# Patient Record
Sex: Female | Born: 1940 | Hispanic: No | Marital: Married | Smoking: Never smoker
Health system: Southern US, Community
[De-identification: ages and names within clinical notes are randomized; demographics above are authoritative.]

---

## 1998-07-23 ENCOUNTER — Other Ambulatory Visit: Admission: RE | Admit: 1998-07-23 | Discharge: 1998-07-23 | Payer: Self-pay | Admitting: Obstetrics and Gynecology

## 2002-05-20 ENCOUNTER — Other Ambulatory Visit: Admission: RE | Admit: 2002-05-20 | Discharge: 2002-05-20 | Payer: Self-pay | Admitting: Gynecology

## 2003-05-01 ENCOUNTER — Encounter: Admission: RE | Admit: 2003-05-01 | Discharge: 2003-05-01 | Payer: Self-pay | Admitting: Gynecology

## 2003-06-17 ENCOUNTER — Other Ambulatory Visit: Admission: RE | Admit: 2003-06-17 | Discharge: 2003-06-17 | Payer: Self-pay | Admitting: Gynecology

## 2004-07-01 ENCOUNTER — Encounter: Admission: RE | Admit: 2004-07-01 | Discharge: 2004-07-01 | Payer: Self-pay | Admitting: Gynecology

## 2005-05-20 ENCOUNTER — Encounter: Admission: RE | Admit: 2005-05-20 | Discharge: 2005-05-20 | Payer: Self-pay | Admitting: Unknown Physician Specialty

## 2010-05-09 ENCOUNTER — Encounter: Payer: Self-pay | Admitting: Gynecology

## 2021-04-23 ENCOUNTER — Encounter: Payer: Self-pay | Admitting: Internal Medicine

## 2021-04-23 ENCOUNTER — Ambulatory Visit: Payer: Medicare Other | Admitting: Internal Medicine

## 2021-04-23 ENCOUNTER — Other Ambulatory Visit: Payer: Self-pay

## 2021-04-23 VITALS — BP 122/68 | HR 78 | Temp 98.0°F | Ht 59.0 in | Wt 117.8 lb

## 2021-04-23 DIAGNOSIS — Z836 Family history of other diseases of the respiratory system: Secondary | ICD-10-CM | POA: Diagnosis not present

## 2021-04-23 DIAGNOSIS — J849 Interstitial pulmonary disease, unspecified: Secondary | ICD-10-CM

## 2021-04-23 NOTE — Patient Instructions (Signed)
ICD-10-CM   1. ILD (interstitial lung disease) (HCC)  J84.9     2. Family history of pulmonary fibrosis  Z83.6       Unclear cause of pulmnary fibrosis Best to restage and reassess  Plan - do full PFT  - do HRCT supine and prone - ideally within our system so we can have our radiologist look at image - get CD Rom of outside CT and drop it off to [place] so we can load into our system for comparison - refer [facility] genetics counselor Maylon Cos and associates  Followup  - return to see Dr Marchelle Gearing 30 min visit but after CT and local PFT

## 2021-04-23 NOTE — Progress Notes (Signed)
Kelli Steele  Kelli Steele is a 81 y.o. female who presents for evaluation of shortness of breath. Patient had COVID in February and since then has had difficulty with shortness of breath. She denies any significant cough and has had recent difficulty with sinusitis. In the past she has had trouble with allergic rhinitis and occasional bronchitis with wheezing. There is no prior history of cigarette smoking. There is no prior history of definite lung disease. Because of the shortness of breath symptoms she underwent cardiac CT which showed obstruction and subsequently underwent stent placement in March. Unfortunately it did not help her shortness of breath symptoms. An echocardiogram at that time showed mild aortic stenosis and mild TR and MR. With mild pulmonary hypertension in the 37 to 49 mmHg range. Patient currently denies any significant cough symptoms. She denies exposures to any organic or organic dust such as asbestos, but does coal dust or mold. She has no pet birds. There is no history of connective tissue disease. 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 She is accompanied by her husband today. She worked in Insurance underwriter. She loves to play golf.   1. Interstitial lung disease  Atypical pattern for IPF although is at risk since her brother had it. One other possibility is post-COVID pulmonary infiltrates since her symptoms seem to start when she got infected 3 months ago. We will plan repeat high-resolution CT chest in 2 months with return appointment follow. In the meantime will order serologic tests  [facility] Aug Steele 8/15/Steele Kelli Steele is a 81 y.o. female who presents for follow up appointment for ILD. Hx of COVID 2/Steele. 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 She was treated with a 3-day hospital course of nitazoxanide. Today she reports she has continued to improve since hospitalization in June for Cryptosporidium. She had SOB, cough and wheezing which has resolved at this time. Her breathing improved with treatment and use of albuterol inhaler. She does have an occasional irregular heartbeat. She continues to follow up with cardiology. Denies fever, chills, sweats, hemoptysis, changes in appetite or weight, chest pain or tightness. She has started to increase her activity since being in the hospital, walking daily. She would like to be able to play golf again   OV 04/23/2021 --established patient of Dr. Darrick Huntsman at [facility] in [place] - seeing Dr Chase Caller at ILD center [facility]  Subjective:  Patient ID: Kelli Steele, female , DOB: 05/01/1940 , age 32 y.o. , MRN: [id number] , ADDRESS: [place] [address] PCP No primary care provider on file. Patient Care Team: Merian Capron, MD (Unknown Physician Specialty)  This Provider for this visit: Treatment Team:  Attending Provider: Brand Males, MD Cardiioogist  Lamar Blinks  - [facility] - Dr Tilden Dome - [place]   04/23/2021  Chief Complaint  Patient presents with   Consult    Pt is here for a second opinion due to diagnosis of ILD. Pt states she was diagnosed 5/Steele. States she has complaints of SOB with activities.     HPI Kelli Steele 89 y.o. -she is church colleague and friend of Kelli Steele.  They started talking about either having pulmonary fibrosis and their brothers also having  her pulmonary fibrosis and disease.  She sees Dr. Tilden Dome at [facility].  But after talking to her friend she decided to come to the ILD center here in [place].  History is gained from talking to her and also review of the medical records.  She tells me that in 2020 she never had any  problems.  Then in February 2020 right before the onset of the pandemic she had COVID-like symptoms.  That is when she had cough.  After that she seemed to be doing okay.  She is an avid Air cabin crew.  Then in early Steele she had atrial fibrillation in the aftermath of that had a cardiac stent but also ended up with COVID.  She says CT scan after that showed pulmonary fibrosis there was more on the right side of the lung according to history.  Then by May Steele she had severe diarrhea and was diagnosed with cryptosporidium.  She was admitted significantly deconditioned but since then has improved.  In the aftermath of this she did see [facility].  Based on the descriptions of the CT scan findings were not typical for UIP/IPF.  Therefore he opted to repeat CT scan and get serologies.  The serologies I reviewed is negative.  When she did a repeat CT scan in July Steele report below.  The findings are again inconsistent with UIP.  She has had only one pulmonary function test that shows ventilatory defect but normal DLCO and this was in May Steele.  She did give some sputum and apparently this was negative for AFB.  I subsequently confirm this.  She is really puzzled if she has IPF or non-IPF.  And if it is not IPF is related to Cryptosporidium or other etiologies.  She is wondering what the relationship of her brothers ILD and her condition is.   [facility] Comprehensive ILD Questionnaire  Symptoms:  SYMPTOM SCALE - ILD 04/23/2021  Current weight   O2 use ra  Shortness of Breath 0 -> 5 scale with 5 being worst (score 6 If unable to do)  At rest 0  Simple tasks - showers, clothes change, eating, shaving 0  Household (dishes, doing bed, laundry) 2  Shopping 1  Walking level at own pace 1  Walking up Stairs 3  Total (30-36) Dyspnea Score 5  How bad is your cough? 0  How bad is your fatigue 2  How bad is nausea 0  How bad is vomiting?  0  How bad is diarrhea? 0  How bad is anxiety? 2  How bad  is depression 1  Any chronic pain - if so where and how bad no       Past Medical History :  -Gives a diagnosis of asthma in October Steele with asthma symptoms and bronchitis symptoms - No collagen vascular disease - Positive for acid reflux - Positive for thyroid problems - Atrial fibrillation  -She is vaccinated against COVID but she thinks she got COVID in February 2020 while in [place] but then again?  February Steele   ROS:  -Positive for fatigue for the last several weeks.- -Positive for dry eyes.= -She has lost weight since February Steele -Positive for mild nausea  FAMILY HISTORY of LUNG DISEASE:  -Brother died from pulmonary fibrosis  PERSONAL EXPOSURE HISTORY:  -Non-smoker.  As a child her mother and father smoked but they quit at the age of 64.  She never did any marijuana.  Other than once in a while very little  in the remote past.  She used cocaine once at a party very little.  This was in the remote past  HOME  EXPOSURE and HOBBY DETAILS :   [place].  Age of the current home is 62.  No dampness.  No mold or mildew.  No current humidifier use.  She does have a nebulizer with is no mold or mildew in it.  She does have a [place] which she uses once in a while.  No misting Fountain no pet birds no pet gerbils.  She does have a goose down feather.  She does not know if there is mold in the [facility] duct.  She does do gardening of flowers.  She does get exposed to many of her damp oil and woodchips around the flower garden.  In essence gardening and down pillow or organic antigen exposures at home  OCCUPATIONAL HISTORY (122 questions) : Extensive list of inorganic antigen exposures and organic antigen exposures are negative  PULMONARY TOXICITY HISTORY (27 items):  Denies any amiodarone or nitrofurantoin  INVESTIGATIONS:    Autoimmune panel May Steele at [place] negative, anti-Jo 1 negative, Sjogren's negative, scleroderma  negative, rheumatoid factor negative -Hypersensitive pneumonitis panel Aspergillus or obesity and pigeon serum negative -ESR 11 -Angiotensin-converting enzymes 68 and considered normal in May Steele at [facility]   PFT    PFT data for ILD  [facility] may Steele  FVC 1.19  FVC % 53%  Ratio 112%  TLC   TLC %   DLCO 28  DLCO % 172%   Simple office walk 185 feet x  3 laps goal with forehead probe 04/23/2021    O2 used ra   Number laps completed avg   Comments about pace 3   Resting Pulse Ox/HR 100% and 78/min   Final Pulse Ox/HR 98% and 95/min   Desaturated </= 88% no   Desaturated <= 3% points no   Got Tachycardic >/= 90/min yes   Symptoms at end of test Mild dysnea   Miscellaneous comments x      CT Chest data - HRCT  July Steele at [place]. Image NA  This result has an attachment that is not available.  COMPARISON: 5/4/Steele.  INDICATION: ild  TECHNIQUE:  CT CHEST HIGH RESOLUTION -helical CT was performed through the chest with high-resolution technique which includes some space high-resolution axial sections. Neither prone, nor Expiration views were obtained. Contrast: None Thin axial sections were performed. Multiplanar reformats were generated on a workstation and saved to PACS.  Radiation dose reduction was utilized (automated exposure control, mA or kV adjustment based on patient size, or iterative image reconstruction).   Exam date/time: 7/11/Steele 12:18 PM   FINDINGS:   LOWER NECK AND AXILLAE:  Scant thyroid tissue seen. Axillary regions are also grossly unremarkable.   LUNGS/PLEURA:  Primarily in the upper lobes, particularly on the right, there there are peribronchovascular infiltrates with minimal nodularity an peripheral interstitial septal thickening. Mild bronchial wall thickening, without bronchiectasis. There is calcification in the tracheobronchial tree which is a benign finding. The pattern and extent of the infiltrates anterior interstitial changes is nearly  identical to study from 5/4/Steele.  No pneumothorax.  No abnormal pulmonary masses.  No pleural effusions, but there is some pleural or subpleural fascial thickening in the upper lobes.   HEART/MEDIASTINUM:  Cardiac size is normal.  Coronary Vessels: Moderate three-vessel coronary atherosclerosis.  No acute thoracic aortic abnormalities. Ascending aorta is  2.7 cm.  No hilar, mediastinal, or axillary lymphadenopathy.   MUSCULOSKELETAL:  No acute or destructive osseous processes.   CHRONIC/INCIDENTAL FINDINGS:  #  Mild distal esophageal thickening is some gastroesophageal reflux.  #  Interval development of some ascites.  #  Limited visualization of the upper abdomen is otherwise benign-appearing Procedure Note  Stark Jock, MD - 07/11/Steele  Formatting of this note might be different from the original.  COMPARISON: 5/4/Steele.  INDICATION: ild  TECHNIQUE:  CT CHEST HIGH RESOLUTION -helical CT was performed through the chest with high-resolution technique which includes some space high-resolution axial sections. Neither prone, nor Expiration views were obtained. Contrast: None Thin axial sections were performed. Multiplanar reformats were generated on a workstation and saved to PACS.  Radiation dose reduction was utilized (automated exposure control, mA or kV adjustment based on patient size, or iterative image reconstruction).   Exam date/time: 7/11/Steele 12:18 PM   FINDINGS:   LOWER NECK AND AXILLAE:  Scant thyroid tissue seen. Axillary regions are also grossly unremarkable.   LUNGS/PLEURA:  Primarily in the upper lobes, particularly on the right, there there are peribronchovascular infiltrates with minimal nodularity an peripheral interstitial septal thickening. Mild bronchial wall thickening, without bronchiectasis. There is calcification in the tracheobronchial tree which is a benign finding. The pattern and extent of the infiltrates anterior interstitial changes is nearly  identical to study from 5/4/Steele.  No pneumothorax.  No abnormal pulmonary masses.  No pleural effusions, but there is some pleural or subpleural fascial thickening in the upper lobes.   HEART/MEDIASTINUM:  Cardiac size is normal.  Coronary Vessels: Moderate three-vessel coronary atherosclerosis.  No acute thoracic aortic abnormalities. Ascending aorta is 2.7 cm.  No hilar, mediastinal, or axillary lymphadenopathy.   MUSCULOSKELETAL:  No acute or destructive osseous processes.   CHRONIC/INCIDENTAL FINDINGS:  #  Mild distal esophageal thickening is some gastroesophageal reflux.  #  Interval development of some ascites.  #  Limited visualization of the upper abdomen is otherwise benign-appearing    IMPRESSION:  1.  Stable pattern of right greater than left, predominantly upper lobe peribronchial vascular infiltrates in an area interstitial bands and focal peripheral interstitial septal thickening. Bilateral upper lobe pleural or subpleural fascial thickening.  2.  New ascites in the upper abdomen.  3.  Coronary atherosclerosis.  4.  Mild distal esophageal thickening and gastroesophageal reflux.    No results found.    PFT  No flowsheet data found.     has no past medical history on file.   reports that she has never smoked. She has never used smokeless tobacco.  The histories are not reviewed yet. Please review them in the "History" navigator section and refresh this [place].  Allergies  Allergen Reactions   Doxycycline Diarrhea and Rash   Sulfur Hives   Codeine Itching    Immunization History  Administered Date(s) Administered   Influenza, High Dose Seasonal PF 12/01/Steele   PFIZER(Purple Top)SARS-COV-2 Vaccination 04/30/2019, 05/21/2019   Pneumococcal Conjugate-13 04/05/2013   Pneumococcal Polysaccharide-23 07/11/2008   Zoster Recombinat (Shingrix) 12/31/2016, 05/06/2017   Zoster, Live 09/10/2009    No family history on file.   Current Outpatient  Medications:    albuterol (ACCUNEB) 1.25 MG/3ML nebulizer solution, Take 3 mLs (1.25 mg dose) by nebulization every 6 (six) hours as needed for Wheezing., Disp: , Rfl:    ALPRAZolam (XANAX) 0.25 MG tablet, Take 0.25 mg by mouth 2 (two) times daily as needed., Disp: , Rfl:    aspirin EC 81 MG  tablet, Take 81 mg by mouth daily. Swallow whole., Disp: , Rfl:    atorvastatin (LIPITOR) 10 MG tablet, Take by mouth., Disp: , Rfl:    carvedilol (COREG) 25 MG tablet, Take 25 mg by mouth 2 (two) times daily., Disp: , Rfl:    diltiazem (CARDIZEM SR) 120 MG 12 hr capsule, Take by mouth., Disp: , Rfl:    fluticasone (FLONASE) 50 MCG/ACT nasal spray, Place 2 sprays into both nostrils daily., Disp: , Rfl:    furosemide (LASIX) 20 MG tablet, Take 20 mg by mouth 2 (two) times daily., Disp: , Rfl:    levothyroxine (SYNTHROID) 75 MCG tablet, SMARTSIG:1 Tablet(s) By Mouth 6 Times a Week, Disp: , Rfl:    metolazone (ZAROXOLYN) 2.5 MG tablet, Take 2.5 mg by mouth once a week., Disp: , Rfl:    metroNIDAZOLE (METROGEL) 0.75 % gel, Apply topically., Disp: , Rfl:    montelukast (SINGULAIR) 10 MG tablet, Take 10 mg by mouth daily., Disp: , Rfl:    nitroGLYCERIN (NITROSTAT) 0.4 MG SL tablet, Place under the tongue., Disp: , Rfl:    potassium chloride SA (KLOR-CON M) 20 MEQ tablet, Take 20 mEq by mouth 2 (two) times daily., Disp: , Rfl:       Objective:   Vitals:   04/23/21 1044  BP: 122/68  Pulse: 78  Temp: 98 F (36.7 C)  TempSrc: Oral  SpO2: 100%  Weight: 117 lb 12.8 oz (53.4 kg)  Height: 4' 11"  (1.499 m)    Estimated body mass index is 23.79 kg/m as calculated from the following:   Height as of this encounter: 4' 11"  (1.499 m).   Weight as of this encounter: 117 lb 12.8 oz (53.4 kg).  @WEIGHTCHANGE @  Filed Weights   04/23/21 1044  Weight: 117 lb 12.8 oz (53.4 kg)     Physical Exam  General: No distress. thin Neuro: Alert and Oriented x 3. GCS 15. Speech normal Psych: Pleasant Resp:  Barrel  Chest - no.  Wheeze - no, Crackles - no, No overt respiratory distress CVS: Normal heart sounds. Murmurs - no Ext: Stigmata of Connective Tissue Disease - no HEENT: Normal upper airway. PEERL +. No post nasal drip        Assessment:       ICD-10-CM   1. ILD (interstitial lung disease) ([place])  J84.9 Pulmonary function test    CT Chest High Resolution    Ambulatory referral to Genetics    2. Family history of pulmonary fibrosis  Z83.6 Ambulatory referral to Genetics     The story and CT features are inconsistent with UIP.  On the other hand she does seem to have a family history of ILD which can then cause variable phenotypic expressions of ILD.  She also seems to have organic antigen exposure at home making her a candidate for hypersensitive pneumonitis.  The CT features are more in the upper lobe.  However I do not have the images with me.  Its been more than 6 months since he had a CT scan and a breathing test.  She is agreed to get this done and restage.  She is also agreed to meet with our genetics counselor.  She will get the old CT scan.  We will get all this data and then regroup.  At this point she appears overall stable Plan:     Patient Instructions     ICD-10-CM   1. ILD (interstitial lung disease) ([place])  J84.9     2.  Family history of pulmonary fibrosis  Z83.6       Unclear cause of pulmnary fibrosis Best to restage and reassess  Plan - do full PFT  - do HRCT supine and prone - ideally within our system so we can have our radiologist look at image - get CD Rom of outside CT and drop it off to [place] so we can load into our system for comparison - refer [facility] genetics counselor Roma Kayser and associates  Followup  - return to see Dr Chase Caller 30 min visit but after CT and local PFT    SIGNATURE    Dr. Brand Males, M.D., F.C.C.P,  Pulmonary and Critical Care Medicine Staff Physician, [place] Director - Interstitial Lung Disease   Program  Pulmonary [place] at [place], [place], [address]  Pager: [phone], If no answer or between  15:00h - 7:00h: call [phone] Telephone: [phone]  6:06 PM 04/23/2021

## 2021-04-28 ENCOUNTER — Telehealth: Payer: Self-pay | Admitting: Genetic Counselor

## 2021-04-28 NOTE — Telephone Encounter (Signed)
Scheduled appt per 1/6 referral. Pt is aware of appt date and time. Mailed updated calendar to pt per pt request.

## 2021-05-20 ENCOUNTER — Other Ambulatory Visit: Payer: Medicare Other

## 2021-05-25 ENCOUNTER — Other Ambulatory Visit: Payer: Self-pay

## 2021-05-25 ENCOUNTER — Ambulatory Visit (INDEPENDENT_AMBULATORY_CARE_PROVIDER_SITE_OTHER): Payer: Medicare Other

## 2021-05-25 DIAGNOSIS — J849 Interstitial pulmonary disease, unspecified: Secondary | ICD-10-CM

## 2021-06-08 ENCOUNTER — Other Ambulatory Visit: Payer: Self-pay

## 2021-06-08 ENCOUNTER — Encounter: Payer: Self-pay | Admitting: Genetic Counselor

## 2021-06-08 ENCOUNTER — Inpatient Hospital Stay: Payer: Medicare Other | Attending: Internal Medicine | Admitting: Genetic Counselor

## 2021-06-08 ENCOUNTER — Inpatient Hospital Stay: Payer: Medicare Other

## 2021-06-08 DIAGNOSIS — Z836 Family history of other diseases of the respiratory system: Secondary | ICD-10-CM

## 2021-06-08 NOTE — Progress Notes (Signed)
REFERRING PROVIDER: Kalman Shan, MD [address] [place],  [place] [address]  PRIMARY PROVIDER:  Pcp, No  PRIMARY REASON FOR VISIT:  1. Family history of pulmonary fibrosis     HISTORY OF PRESENT ILLNESS:   Ms. Barbero, a 81 y.o. female, was seen for a [facility] cancer genetics consultation at the request of Dr. Marchelle Gearing due to a family history of pulmonary fibrosis.  Ms. Cornia presents to clinic today to discuss the possibility of a hereditary predisposition to pulmonary fibrosis, to discuss genetic testing, and to further clarify her future risks, as well as potential risks for family members.   Ms. Trinidad has a personal history of interstitial lung disease diagnosed at age 54. 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 She has never smoked and did not have premature greying.   FAMILY HISTORY:  We obtained a detailed, 4-generation family history.  Significant diagnoses are listed below: Family History  Problem Relation Age of Onset   Emphysema Mother    COPD Sister    Pulmonary fibrosis Brother    Lung cancer Maternal Uncle    Ms. Safran brother was diagnosed with pulmonary fibrosis at age 79 and died 3 weeks later. She does not have any additional family history of pulmonary fibrosis nor premature graying.    DISCUSSION: Pulmonary fibrosis is a group of lung diseases characterized by development of scarring in the lungs.  There are several different subtypes of pulmonary fibrosis, including idiopathic pulmonary fibrosis (IPF), with different causes that are defined based on their appearance on imaging tests like a CT scan or how lung tissue looks under the microscope after a lung biopsy.  The incidence of pulmonary fibrosis in the general population is approximately 6-16/100,000 people in the [place].     Familial Pulmonary Fibrosis (FPF) is defined by having two or more first degree relatives (parent, sibling or child) in a  family with a diagnosis of pulmonary fibrosis.  In FPF, there is evidence that an underlying inherited genetic component may contribute to the development of pulmonary fibrosis, but environmental factors may also contribute. At present, several genes are known to be associated with FPF and account for approximately 20-25% of FPF cases.  Thus, additional genes remain to be discovered.     Approximately 20% of patients with FPF have a change (or mutation) in one of their telomerase genes, including TERT, TERC, RTEL1, and PARN.  Individuals with mutations in these genes may also be at risk for other health conditions such as low blood counts, early gray hair, or liver disease.  Rarely, FPF is due to mutations in other genes such as SFTPC or SFTPA2.   The inheritance pattern in FPF is unclear and may vary from family to family, but autosomal dominance with reduced penetrance appears most likely.  This means that it only takes one copy of the genetic factor inherited from one parent to have risk for the condition and there is a 50/50 chance of inheriting that genetic factor.  However, reduced penetrance means that not all individuals who inherit the genetic factor will develop symptoms or the disease.    We reviewed the characteristics, features and inheritance patterns of hereditary pulmonary fibrosis syndromes. We also discussed genetic testing, including the process of testing, insurance coverage, and turn-around-time for results. We discussed the implications of a negative, positive, carrier and/or variant of uncertain significant result. We recommended Ms. Pescador pursue genetic testing for the Pulmonary Fibrosis gene  panel through [facility].  PLAN: After considering the risks, benefits, and limitations, Ms. Herrmann provided informed consent to pursue genetic testing and the blood sample was sent to [facility] for analysis of the Pulmonary Fibrosis Gene Panel. Results should be available within approximately  8 weeks' time, at which point they will be disclosed by telephone to Ms. Marich, as will any additional recommendations warranted by these results.   Ms. Isais questions were answered to her satisfaction today. Our contact information was provided should additional questions or concerns arise. Thank you for the referral and allowing [place] to share in the care of your patient.   Lalla Brothers, MS, [facility] Genetic Counselor [email or web address] (P) [phone]  The patient was seen for a total of 55 minutes in face-to-face genetic counseling. The patient was seen alone.  Drs. Pamelia Hoit and/or Mosetta Putt were available to discuss this case as needed.  _______________________________________________________________________ For Office Staff:  Number of people involved in session: 1 Was an Intern/ student involved with case: no

## 2021-06-17 ENCOUNTER — Ambulatory Visit: Payer: Medicare Other | Admitting: Internal Medicine

## 2021-07-02 ENCOUNTER — Other Ambulatory Visit: Payer: Self-pay

## 2021-07-02 ENCOUNTER — Ambulatory Visit (INDEPENDENT_AMBULATORY_CARE_PROVIDER_SITE_OTHER): Payer: Medicare Other | Admitting: Internal Medicine

## 2021-07-02 DIAGNOSIS — J849 Interstitial pulmonary disease, unspecified: Secondary | ICD-10-CM

## 2021-07-02 LAB — PULMONARY FUNCTION TEST
DL/VA % pred: 108 %
DL/VA: 4.6 ml/min/mmHg/L
DLCO cor % pred: 123 %
DLCO cor: 19.94 ml/min/mmHg
DLCO unc % pred: 100 %
DLCO unc: 16.31 ml/min/mmHg
FEF 25-75 Post: 2.32 L/sec
FEF 25-75 Pre: 1.86 L/sec
FEF2575-%Change-Post: 25 %
FEF2575-%Pred-Post: 184 %
FEF2575-%Pred-Pre: 147 %
FEV1-%Change-Post: 4 %
FEV1-%Pred-Post: 61 %
FEV1-%Pred-Pre: 59 %
FEV1-Post: 0.99 L
FEV1-Pre: 0.95 L
FEV1FVC-%Change-Post: 3 %
FEV1FVC-%Pred-Pre: 128 %
FEV6-%Change-Post: 2 %
FEV6-%Pred-Post: 48 %
FEV6-%Pred-Pre: 46 %
FEV6-Post: 0.99 L
FEV6-Pre: 0.96 L
FEV6FVC-%Change-Post: 1 %
FEV6FVC-%Pred-Post: 104 %
FEV6FVC-%Pred-Pre: 102 %
FVC-%Change-Post: 1 %
FVC-%Pred-Post: 46 %
FVC-%Pred-Pre: 45 %
FVC-Post: 1 L
FVC-Pre: 0.99 L
Post FEV1/FVC ratio: 98 %
Post FEV6/FVC ratio: 98 %
Pre FEV1/FVC ratio: 95 %
Pre FEV6/FVC Ratio: 96 %
RV % pred: 51 %
RV: 1.08 L
TLC % pred: 63 %
TLC: 2.8 L

## 2021-07-02 NOTE — Progress Notes (Signed)
Full PFT completed today ? ?

## 2021-07-06 ENCOUNTER — Telehealth: Payer: Self-pay | Admitting: Internal Medicine

## 2021-07-06 ENCOUNTER — Other Ambulatory Visit: Payer: Self-pay

## 2021-07-06 ENCOUNTER — Encounter: Payer: Self-pay | Admitting: Internal Medicine

## 2021-07-06 ENCOUNTER — Ambulatory Visit: Payer: Medicare Other | Admitting: Internal Medicine

## 2021-07-06 VITALS — BP 118/64 | HR 81 | Temp 97.9°F | Ht 59.0 in | Wt 139.0 lb

## 2021-07-06 DIAGNOSIS — Z836 Family history of other diseases of the respiratory system: Secondary | ICD-10-CM | POA: Diagnosis not present

## 2021-07-06 DIAGNOSIS — J849 Interstitial pulmonary disease, unspecified: Secondary | ICD-10-CM | POA: Diagnosis not present

## 2021-07-06 LAB — CBC WITH DIFFERENTIAL/PLATELET
Basophils Absolute: 0.1 10*3/uL (ref 0.0–0.1)
Basophils Relative: 1.4 % (ref 0.0–3.0)
Eosinophils Absolute: 0.3 10*3/uL (ref 0.0–0.7)
Eosinophils Relative: 5.9 % — ABNORMAL HIGH (ref 0.0–5.0)
HCT: 28.3 % — ABNORMAL LOW (ref 36.0–46.0)
Hemoglobin: 9.1 g/dL — ABNORMAL LOW (ref 12.0–15.0)
Lymphocytes Relative: 12.1 % (ref 12.0–46.0)
Lymphs Abs: 0.7 10*3/uL (ref 0.7–4.0)
MCHC: 32.1 g/dL (ref 30.0–36.0)
MCV: 85.8 fl (ref 78.0–100.0)
Monocytes Absolute: 0.6 10*3/uL (ref 0.1–1.0)
Monocytes Relative: 10.5 % (ref 3.0–12.0)
Neutro Abs: 4.1 10*3/uL (ref 1.4–7.7)
Neutrophils Relative %: 70.1 % (ref 43.0–77.0)
Platelets: 207 10*3/uL (ref 150.0–400.0)
RBC: 3.3 Mil/uL — ABNORMAL LOW (ref 3.87–5.11)
RDW: 27.3 % — ABNORMAL HIGH (ref 11.5–15.5)
WBC: 5.9 10*3/uL (ref 4.0–10.5)

## 2021-07-06 NOTE — Telephone Encounter (Signed)
Irving Burton ? ?Instructed patient to leave the outside CD-ROM with the front desk.  Please get this and send it for uploading into our PACS system ? ?Thanks ? ? ? ?SIGNATURE  ? ? ?Dr. Kalman Shan, M.D., F.C.C.P,  ?Pulmonary and Critical Care Medicine ?Staff Physician, [facility] ?Center Director - Interstitial Lung Disease  Program  ?Pulmonary Fibrosis [facility] ?[place], [place], [address] ? ?NPI Number:  NPI #[id number] ?DEA Number: [id number] ? ?Pager: [phone], If no answer  -> Check AMION or Try [phone] ?Telephone (clinical office): [phone] ?Telephone (research): [phone] ? ?12:36 PM ?07/06/2021 ? ?

## 2021-07-06 NOTE — Patient Instructions (Addendum)
ICD-10-CM   ?1. ILD (interstitial lung disease) ([place])  J84.9   ?  ?2. Family history of pulmonary fibrosis  Z83.6   ?  ? ?Looks less likely this is IPF ?Could be NSIP or HP from the down pillow ? 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 ?Plan ?- drop off - get CD Rom of outside CT and drop it off to [place] so we can load into our system for comparison ?- do cbc with diff, blodd IgE, RAST allergy profile ?- hold off biopsy (which is indicated for your undifferentiaed ILD) due to recent cirrhosis GI bleed ? - hold off anti-=fibrotics till we document progression esp in context of cirrhosis ?-  will discuss in our case conference next few months  ?- repeat spiro/dlco in 3 months ? ?Followup ? - return to see Dr Chase Caller 30 min visit local PFT in 3 months ?

## 2021-07-06 NOTE — Progress Notes (Signed)
? ? ?[facility] Chest May 2022 ? ?Kelli Steele is a 81 y.o. female who presents for evaluation of shortness of breath. Patient had COVID in February and since then has had difficulty with shortness of breath. She denies any significant cough and has had recent difficulty with sinusitis. In the past she has had trouble with allergic rhinitis and occasional bronchitis with wheezing. There is no prior history of cigarette smoking. There is no prior history of definite lung disease. Because of the shortness of breath symptoms she underwent cardiac CT which showed obstruction and subsequently underwent stent placement in March. Unfortunately it did not help her shortness of breath symptoms. An echocardiogram at that time showed mild aortic stenosis and mild TR and MR. With mild Steele hypertension in the 37 to 49 mmHg range. Patient currently denies any significant cough symptoms. She denies exposures to any organic or organic dust such as asbestos, but does coal dust or mold. She has no pet birds. There is no history of connective tissue disease. 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 She is accompanied by her husband today. She worked in [employer] under[employer]. She loves to play golf. ? ? 1. Interstitial lung disease ? ?Atypical pattern for IPF although is at risk since her brother had it. One other possibility is post-COVID Steele infiltrates since her symptoms seem to start when she got infected 3 months ago. We will plan repeat high-resolution CT chest in 2 months with return appointment follow. In the meantime will order serologic tests ? ?[place] [facility] Aug 2022 ?11/30/2020 ?Kelli Steele is a 81 y.o. female who presents for follow up appointment for ILD. Hx of COVID 05/2020. 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 She was treated with a 3-day hospital course of nitazoxanide. Today she reports she has continued to improve since hospitalization in June for Cryptosporidium. She had SOB, cough and wheezing which has resolved at this time. Her breathing improved with treatment and use of albuterol inhaler. She does have an occasional irregular heartbeat. She continues to follow up with cardiology. Denies fever, chills, sweats, hemoptysis, changes in appetite or weight, chest pain or tightness. She has started to increase her activity since being in the hospital, walking daily. She would like to be able to play golf again ? ? ?OV 04/23/2021 --established patient of Kelli. Darrick Huntsman at [facility] chest [facility] in [place] - seeing Kelli Steele at [place] [facility] Kelli Steele ? ?Subjective:  ?Patient ID: Kelli Steele, female , DOB: [birth year] , age 48 y.o. , MRN: [id number] , ADDRESS: [address] ?[place] [id number] ?PCP No primary care provider on file. ?Patient Care Team: ?Merian Capron, MD (Unknown Physician Specialty) ? ?This Provider for this visit: Treatment Team:  ?Attending Provider: Brand Males, MD ?[place] ?Pulmnary - Kelli Steele - [facility] ? ? ?04/23/2021  ?Chief Complaint  ?Patient presents with  ? Consult  ?  Pt is here for a second opinion due to diagnosis of ILD. Pt states she was diagnosed 08/2020. States she has complaints of SOB with activities.  ? ? ? ?HPI ?Kelli Steele 77 y.o. -she is church colleague and friend of Kelli Steele.  They started talking about either having Steele fibrosis and their brothers  also having her Steele fibrosis and disease.  She sees Kelli. Tilden Steele at [facility] chest.  But after talking to her friend she decided to come to the ILD center here in [place].  History is gained from talking to her and also review of the medical records.  She tells me that in 2020 she never had  any problems.  Then in February 2020 right before the onset of the pandemic she had COVID-like symptoms.  That is when she had cough.  After that she seemed to be doing okay.  She is an avid Air cabin crew.  Then in early 2022 she had atrial fibrillation in the aftermath of that had a cardiac stent but also ended up with COVID.  She says CT scan after that showed Steele fibrosis there was more on the right side of the lung according to history.  Then by May 2022 she had severe diarrhea and was diagnosed with cryptosporidium.  She was admitted significantly deconditioned but since then has improved.  In the aftermath of this she did see Kelli Steele.  Based on the descriptions of the CT scan findings were not typical for UIP/IPF.  Therefore he opted to repeat CT scan and get serologies.  The serologies I reviewed is negative.  When she did a repeat CT scan in July 2022 report below.  The findings are again inconsistent with UIP.  She has had only one Steele function test that shows ventilatory defect but normal DLCO and this was in May 2022.  She did give some sputum and apparently this was negative for AFB.  I subsequently confirm this.  She is really puzzled if she has IPF or non-IPF.  And if it is not IPF is related to Cryptosporidium or other etiologies.  She is wondering what the relationship of her brothers ILD and her condition is. ? ? ?[facility] Comprehensive ILD Questionnaire ? ?Symptoms:  ?SYMPTOM SCALE - ILD 04/23/2021  ?Current weight   ?O2 use ra  ?Shortness of Breath 0 -> 5 scale with 5 being worst (score 6 If unable to do)  ?At rest 0  ?Simple tasks - showers, clothes change, eating, shaving 0  ?Household (dishes, doing bed, laundry) 2  ?Shopping 1  ?Walking level at own pace 1  ?Walking up Stairs 3  ?Total (30-36) Dyspnea Score 5  ?How bad is your cough? 0  ?How bad is your fatigue 2  ?How bad is nausea 0  ?How bad is vomiting? ? 0  ?How bad is diarrhea? 0  ?How bad is anxiety? 2  ?How  bad is depression 1  ?Any chronic pain - if so where and how bad no  ? ? ? ? ? ?Past Medical History :  ?-Gives a diagnosis of asthma in October 2022 with asthma symptoms and bronchitis symptoms ?- No collagen vascular disease ?- Positive for acid reflux ?- Positive for thyroid problems ?- Atrial fibrillation ? ?-She is vaccinated against COVID but she thinks she got COVID in February 2020 while in [place] but then again?  February 2022 ? ? ?ROS:  ?-Positive for fatigue for the last several weeks.- ?-Positive for dry eyes.= ?-She has lost weight since February 2022 ?-Positive for mild nausea ? ?FAMILY HISTORY of LUNG DISEASE:  ?-Brother died from Steele fibrosis ? ?PERSONAL EXPOSURE HISTORY:  ?-Non-smoker.  As a child her mother and father smoked but they quit at the age of 94.  She never did any marijuana.  Other than once in a while  very little in the remote past.  She used cocaine once at a party very little.  This was in the remote past ? ?HOME  EXPOSURE and HOBBY DETAILS :  ? -[place].  Age of the current home is 2.  No dampness.  No mold or mildew.  No current humidifier use.  She does have a nebulizer with is no mold or mildew in it.  She does have a [place] which she uses once in a while.  No misting Fountain no pet birds no pet gerbils.  She does have a goose down feather.  She does not know if there is mold in the [facility] duct.  She does do gardening of flowers.  She does get exposed to many of her damp oil and woodchips around the flower garden. ? ?In essence gardening and down pillow or organic antigen exposures at home ? ?OCCUPATIONAL HISTORY (122 questions) : ?Extensive list of inorganic antigen exposures and organic antigen exposures are negative ? ?Steele TOXICITY HISTORY (27 items):  ?Denies any amiodarone or nitrofurantoin ? ?INVESTIGATIONS: ? ? ? ?Autoimmune panel May 2022 at [place] ?- ANA negative, anti-Jo 1 negative, Sjogren's negative,  scleroderma negative, rheumatoid factor negative ?-Hypersensitive pneumonitis panel Aspergillus or obesity and pigeon serum negative ?-ESR 11 ?-Angiotensin-converting enzymes 68 and considered normal in May 2022

## 2021-07-07 ENCOUNTER — Ambulatory Visit
Admission: RE | Admit: 2021-07-07 | Discharge: 2021-07-07 | Disposition: A | Discharge status: Home / Self Care | Payer: Self-pay | Source: Ambulatory Visit | Attending: Internal Medicine | Admitting: Internal Medicine

## 2021-07-07 DIAGNOSIS — J849 Interstitial pulmonary disease, unspecified: Secondary | ICD-10-CM

## 2021-07-07 LAB — RESPIRATORY ALLERGY PROFILE REGION II ~~LOC~~
Allergen, A. alternata, m6: <0.1 kU/L
Allergen, Cedar tree, t12: 1.84 kU/L — ABNORMAL HIGH
Allergen, Comm Silver Birch, t9: 2.25 kU/L — ABNORMAL HIGH
Allergen, Cottonwood, t14: 1.48 kU/L — ABNORMAL HIGH
Allergen, D pternoyssinus,d7: <0.1 kU/L
Allergen, Mouse Urine Protein, e78: <0.1 kU/L
Allergen, Mulberry, t76: 1.68 kU/L — ABNORMAL HIGH
Allergen, Oak,t7: 2.43 kU/L — ABNORMAL HIGH
Allergen, P. notatum, m1: <0.1 kU/L
Aspergillus fumigatus, m3: <0.1 kU/L
Bermuda Grass: 4.81 kU/L — ABNORMAL HIGH
Box Elder IgE: 2.49 kU/L — ABNORMAL HIGH
CLADOSPORIUM HERBARUM (M2) IGE: <0.1 kU/L
COMMON RAGWEED (SHORT) (W1) IGE: 3.12 kU/L — ABNORMAL HIGH
Cat Dander: <0.1 kU/L
Class: 0
Class: 0
Class: 0
Class: 0
Class: 0
Class: 0
Class: 0
Class: 0
Class: 2
Class: 2
Class: 2
Class: 2
Class: 2
Class: 2
Class: 2
Class: 2
Class: 2
Class: 2
Class: 2
Class: 2
Class: 2
Class: 3
Class: 3
Cockroach: 1.89 kU/L — ABNORMAL HIGH
D. farinae: 0.28 kU/L — ABNORMAL HIGH
Dog Dander: <0.1 kU/L
Elm IgE: 2.56 kU/L — ABNORMAL HIGH
IgE (Immunoglobulin E), Serum: 168 kU/L — ABNORMAL HIGH (ref <=114)
Johnson Grass: 2.57 kU/L — ABNORMAL HIGH
Pecan/Hickory Tree IgE: 2.28 kU/L — ABNORMAL HIGH
Rough Pigweed  IgE: 2.38 kU/L — ABNORMAL HIGH
Sheep Sorrel IgE: 4.65 kU/L — ABNORMAL HIGH
Timothy Grass: 2.95 kU/L — ABNORMAL HIGH

## 2021-07-07 LAB — INTERPRETATION:

## 2021-07-07 NOTE — Telephone Encounter (Signed)
CD has been obtained from pt and placed in a envelope to have it sent to [facility]. Called [employer] to let them know that we had disc ready for pick up that needed to be sent to [facility] and understanding was verbalized. Will await disc to be returned prior to routing this encounter to MR. ?

## 2021-07-15 ENCOUNTER — Ambulatory Visit: Payer: Medicare Other | Admitting: Internal Medicine

## 2021-07-16 NOTE — Telephone Encounter (Signed)
Disc has been returned after it was uploaded into canopy. Routing to MR as an Financial planner. Nothing further needed. ?

## 2021-07-23 ENCOUNTER — Encounter: Payer: Self-pay | Admitting: Genetic Counselor

## 2021-07-23 ENCOUNTER — Telehealth: Payer: Self-pay | Admitting: Genetic Counselor

## 2021-07-23 DIAGNOSIS — Z1379 Encounter for other screening for genetic and chromosomal anomalies: Secondary | ICD-10-CM | POA: Insufficient documentation

## 2021-07-23 NOTE — Telephone Encounter (Signed)
I contacted Ms. Danker to discuss her genetic testing results. No pathogenic variants were identified in the genes analyzed. Detailed clinic note to follow. ? ?The test report has been scanned into EPIC and is located under the Molecular Pathology section of the Results Review tab.  A portion of the result report is included below for reference.  ? ?Lucille Passy, MS, [place] ?Genetic Counselor ?[email or web address] ?(P) [phone] ? ? ?

## 2021-07-26 ENCOUNTER — Ambulatory Visit: Payer: Self-pay | Admitting: Genetic Counselor

## 2021-07-26 DIAGNOSIS — Z1379 Encounter for other screening for genetic and chromosomal anomalies: Secondary | ICD-10-CM

## 2021-07-26 NOTE — Progress Notes (Signed)
HPI:   ?Kelli Steele was previously seen in the [facility] due to a family history of pulmonary fibrosis. Please refer to our prior genetics clinic note for more information regarding our discussion, assessment and recommendations, at the time. Kelli Steele recent genetic test results were disclosed to her, as were recommendations warranted by these results. These results and recommendations are discussed in more detail below. ? ?CANCER HISTORY:  ?Oncology History  ? No history exists.  ? ? ?FAMILY HISTORY:  ?We obtained a detailed, 4-generation family history.  Significant diagnoses are listed below: ?     ?Family History  ?Problem Relation Age of Onset  ? Emphysema Mother    ? COPD Sister    ? Pulmonary fibrosis Brother    ? Lung cancer Maternal Uncle    ?  ?Kelli Steele's brother was diagnosed with pulmonary fibrosis at age 34 and died 3 weeks later. She does not have any additional family history of pulmonary fibrosis nor premature graying. ?  ?  ? ?GENETIC TEST RESULTS:  ?The [facility] Pulmonary Fibrosis Gene Panel found no pathogenic mutations.  ? ?The test report has been scanned into EPIC and is located under the Molecular Pathology section of the Results Review tab.  A portion of the result report is included below for reference. Genetic testing reported out on 07/21/2021.  ? ? ? ? ?Even though a pathogenic variant was not identified, possible explanations for the family history of pulmonary fibrosis may include: ?There may be no hereditary risk for pulmonary fibrosis in the family. The family history be due to other genetic or environmental factors. ?There may be a gene mutation in one of these genes that current testing methods cannot detect, but that chance is small. ?There could be another gene that has not yet been discovered, or that we have not yet tested, that is responsible for the pulmonary fibrosis in the family.  ?It is also possible there is a hereditary cause for the  pulmonary fibrosis in the family that Kelli Steele did not inherit. ? ?Therefore, it is important to remain in touch with genetics in the future so that we can continue to offer Kelli Steele the most up to date genetic testing.  ? ?ADDITIONAL GENETIC TESTING:  ?We discussed with Kelli Steele that her genetic testing was fairly extensive.  If there are genes identified to increase the risk for pulmonary fibrosis that can be analyzed in the future, we would be happy to discuss and coordinate this testing at that time.   ? ?SCREENING RECOMMENDATIONS:  ?Kelli Steele's test result is considered negative (normal).  This means that we have not identified a hereditary cause for her family history of pulmonary fibrosis at this time.  Therefore, it is recommended she continue to follow screening guidelines provided by her healthcare providers.  ? ?Our contact number was provided. Kelli Steele questions were answered to her satisfaction, and she knows she is welcome to call us at anytime with additional questions or concerns.  ? ?Kelli Brothers, Kelli Steele, Kelli Steele ?Genetic Counselor ?[email or web address] ?(P) [phone] ? ? ?

## 2021-08-02 ENCOUNTER — Encounter: Payer: Self-pay | Admitting: Genetic Counselor

## 2021-08-07 ENCOUNTER — Telehealth: Payer: Self-pay | Admitting: Internal Medicine

## 2021-08-07 DIAGNOSIS — R059 Cough, unspecified: Secondary | ICD-10-CM

## 2021-08-07 DIAGNOSIS — R0602 Shortness of breath: Secondary | ICD-10-CM

## 2021-08-07 NOTE — Telephone Encounter (Signed)
? ?  Blood allergy panel is strongly positive for various environmental things ? ?Plan ? - refer to allergist ? ? ? ?SIGNATURE  ? ? ?Dr. Brand Males, M.D., F.C.C.P,  ?Pulmonary and Critical Care Medicine ?Staff Physician, [place] ?Center Director - Interstitial Lung Disease  Program  ?Medical Director - [place] ICU ?Pulmonary [place] [facility] ?[place], [place], [address] ? ?NPI Number:  NPI [id number] ?DEA Number: [id number] ? ?Pager: [phone], If no answer  -> Check AMION or Try [phone] ?Telephone (clinical office): [phone] ?Telephone (research): [phone] ? ?3:04 PM ?08/07/2021 ? ? ? ? Latest Reference Range & Units 07/06/21 11:45  ?Sheep Sorrel IgE kU/L 4.65 (H)  ?Pecan/Hickory Tree IgE kU/L 2.28 (H)  ?IgE (Immunoglobulin E), Serum <OR=114 kU/L 168 (H)  ?Allergen, D pternoyssinus,d7 kU/L <0.10  ?Cat Dander kU/L <0.10  ?Dog Dander kU/L <0.10  ?[place] Grass kU/L 4.81 (H)  ?Johnson Grass kU/L 2.57 (H)  ?Timothy Grass kU/L 2.95 (H)  ?Cockroach kU/L 1.89 (H)  ?Aspergillus fumigatus, m3 kU/L <0.10  ?Allergen, Comm Silver Wendee Copp, t9 kU/L 2.25 (H)  ?Allergen, Cottonwood, t14 kU/L 1.48 (H)  ?Elm IgE kU/L 2.56 (H)  ?Allergen, Mulberry, t76 kU/L 1.68 (H)  ?Allergen, Oak,t7 kU/L 2.43 (H)  ?COMMON RAGWEED (SHORT) (W1) IGE kU/L 3.12 (H)  ?Allergen, Mouse Urine Protein, e78 kU/L <0.10  ?D. farinae kU/L 0.28 (H)  ?Allergen, Cedar tree, t12 kU/L 1.84 (H)  ?Box Elder IgE kU/L 2.49 (H)  ?Rough Pigweed  IgE kU/L 2.38 (H)  ?(H): Data is abnormally high ?

## 2021-08-09 NOTE — Telephone Encounter (Signed)
Attempted to call pt but unable to reach. Left message for her to return call. 

## 2021-08-16 NOTE — Telephone Encounter (Signed)
Called and spoke with pt letting her know results of bloodwork and she verbalized understanding. Order placed for referral to allergy. Nothing further needed. ?

## 2021-10-05 ENCOUNTER — Ambulatory Visit (INDEPENDENT_AMBULATORY_CARE_PROVIDER_SITE_OTHER): Payer: Medicare Other | Admitting: Internal Medicine

## 2021-10-05 ENCOUNTER — Encounter: Payer: Self-pay | Admitting: Internal Medicine

## 2021-10-05 VITALS — BP 128/80 | HR 144 | Temp 97.5°F | Ht 59.0 in | Wt 129.0 lb

## 2021-10-05 DIAGNOSIS — J849 Interstitial pulmonary disease, unspecified: Secondary | ICD-10-CM | POA: Diagnosis not present

## 2021-10-05 LAB — PULMONARY FUNCTION TEST
DL/VA % pred: 107 %
DL/VA: 4.54 ml/min/mmHg/L
DLCO cor % pred: 61 %
DLCO cor: 9.67 ml/min/mmHg
DLCO unc % pred: 55 %
DLCO unc: 8.8 ml/min/mmHg
FEF 25-75 Pre: 1.63 L/sec
FEF2575-%Pred-Pre: 137 %
FEV1-%Pred-Pre: 59 %
FEV1-Pre: 0.91 L
FEV1FVC-%Pred-Pre: 119 %
FEV6-%Pred-Pre: 51 %
FEV6-Pre: 1 L
FEV6FVC-%Pred-Pre: 105 %
FVC-%Pred-Pre: 49 %
FVC-Pre: 1.03 L
Pre FEV1/FVC ratio: 88 %
Pre FEV6/FVC Ratio: 99 %

## 2021-10-05 NOTE — Progress Notes (Signed)
[facility] Chest May 2022  Kelli Steele is a 81 y.o. female who presents for evaluation of shortness of breath. Patient had COVID in February and since then has had difficulty with shortness of breath. She denies any significant cough and has had recent difficulty with sinusitis. In the past she has had trouble with allergic rhinitis and occasional bronchitis with wheezing. There is no prior history of cigarette smoking. There is no prior history of definite lung disease. Because of the shortness of breath symptoms she underwent cardiac CT which showed obstruction and subsequently underwent stent placement in March. Unfortunately it did not help her shortness of breath symptoms. An echocardiogram at that time showed mild aortic stenosis and mild TR and MR. With mild pulmonary hypertension in the 37 to 49 mmHg range. Patient currently denies any significant cough symptoms. She denies exposures to any organic or organic dust such as asbestos, but does coal dust or mold. She has no pet birds. There is no history of connective tissue disease. 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 She is accompanied by her husband today. She worked in Insurance underwriter. She loves to play golf.   1. Interstitial lung disease  Atypical pattern for IPF although is at risk since her brother had it. One other possibility is post-COVID pulmonary infiltrates since her symptoms seem to start when she got infected 3 months ago. We will plan repeat high-resolution CT chest in 2 months with return appointment follow. In the meantime will order serologic tests  [facility] Chest Aug 2022 11/30/2020 Kelli Steele is a 81 y.o. female who presents for follow up appointment for ILD. Hx of COVID 05/2020. 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 She was treated with a 3-day hospital course of nitazoxanide. Today she reports she has continued to improve since hospitalization in June for Cryptosporidium. She had SOB, cough and wheezing which has resolved at this time. Her breathing improved with treatment and use of albuterol inhaler. She does have an occasional irregular heartbeat. She continues to follow up with cardiology. Denies fever, chills, sweats, hemoptysis, changes in appetite or weight, chest pain or tightness. She has started to increase her activity since being in the hospital, walking daily. She would like to be able to play golf again   OV 04/23/2021 --established patient of Dr. Darrick Huntsman at [facility] chest [facility] in [place] - seeing Dr Chase Caller at ILD center [facility]  Subjective:  Patient ID: Kelli Steele, female , DOB: Aug 22, 1940 , age 81 y.o. , MRN: [id number] , ADDRESS: [place] [id number] PCP No primary care provider on file. Patient Care Team: Merian Capron, MD (Unknown Physician Specialty)  This Provider for this visit: Treatment Team:  Attending Provider: Brand Males, MD Cardiioogist  Lamar Blinks  - [facility] - Dr Tilden Dome - [place]   04/23/2021  Chief Complaint  Patient presents with   Consult    Pt is here for a second opinion due to diagnosis of ILD. Pt states she was diagnosed 08/2020. States she has complaints of SOB with activities.     HPI Kelli Steele 46 y.o. -she is church colleague and friend of Kelli Steele.  They started talking about either having pulmonary fibrosis and their  brothers also having her pulmonary fibrosis and disease.  She sees Dr. Tilden Dome at [facility] chest.  But after talking to her friend she decided to come to the ILD center here in [place].  History is gained from talking to her and also review of the medical records.  She tells me that in 2020 she never had  any problems.  Then in February 2020 right before the onset of the pandemic she had COVID-like symptoms.  That is when she had cough.  After that she seemed to be doing okay.  She is an avid Air cabin crew.  Then in early 2022 she had atrial fibrillation in the aftermath of that had a cardiac stent but also ended up with COVID.  She says CT scan after that showed pulmonary fibrosis there was more on the right side of the lung according to history.  Then by May 2022 she had severe diarrhea and was diagnosed with cryptosporidium.  She was admitted significantly deconditioned but since then has improved.  In the aftermath of this she did see [facility] pulmonary.  Based on the descriptions of the CT scan findings were not typical for UIP/IPF.  Therefore he opted to repeat CT scan and get serologies.  The serologies I reviewed is negative.  When she did a repeat CT scan in July 2022 report below.  The findings are again inconsistent with UIP.  She has had only one pulmonary function test that shows ventilatory defect but normal DLCO and this was in May 2022.  She did give some sputum and apparently this was negative for AFB.  I subsequently confirm this.  She is really puzzled if she has IPF or non-IPF.  And if it is not IPF is related to Cryptosporidium or other etiologies.  She is wondering what the relationship of her brothers ILD and her condition is.   [facility] Comprehensive ILD Questionnaire  Symptoms:  SYMPTOM SCALE - ILD 04/23/2021  Current weight   O2 use ra  Shortness of Breath 0 -> 5 scale with 5 being worst (score 6 If unable to do)  At rest 0  Simple tasks - showers, clothes change, eating, shaving 0  Household (dishes, doing bed, laundry) 2  Shopping 1  Walking level at own pace 1  Walking up Stairs 3  Total (30-36) Dyspnea Score 5  How bad is your cough? 0  How bad is your fatigue 2  How bad is nausea 0  How bad is vomiting?  0  How bad is diarrhea? 0  How bad is anxiety? 2  How  bad is depression 1  Any chronic pain - if so where and how bad no       Past Medical History :  -Gives a diagnosis of asthma in October 2022 with asthma symptoms and bronchitis symptoms - No collagen vascular disease - Positive for acid reflux - Positive for thyroid problems - Atrial fibrillation  -She is vaccinated against COVID but she thinks she got COVID in February 2020 while in [place] but then again?  February 2022   ROS:  -Positive for fatigue for the last several weeks.- -Positive for dry eyes.= -She has lost weight since February 2022 -Positive for mild nausea  FAMILY HISTORY of LUNG DISEASE:  -Brother died from pulmonary fibrosis  PERSONAL EXPOSURE HISTORY:  -Non-smoker.  As a child her mother and father smoked but they quit at the age of 25.  She never did any marijuana.  Other than once in a  while very little in the remote past.  She used cocaine once at a party very little.  This was in the remote past  HOME  EXPOSURE and HOBBY DETAILS :   [place].  Age of the current home is 40.  No dampness.  No mold or mildew.  No current humidifier use.  She does have a nebulizer with is no mold or mildew in it.  She does have a [place] which she uses once in a while.  No misting Fountain no pet birds no pet gerbils.  She does have a goose down feather.  She does not know if there is mold in the [facility] duct.  She does do gardening of flowers.  She does get exposed to many of her damp oil and woodchips around the flower garden.  In essence gardening and down pillow or organic antigen exposures at home  OCCUPATIONAL HISTORY (122 questions) : Extensive list of inorganic antigen exposures and organic antigen exposures are negative  PULMONARY TOXICITY HISTORY (27 items):  Denies any amiodarone or nitrofurantoin  INVESTIGATIONS:    Autoimmune panel May 2022 at [facility] negative, anti-Jo 1 negative, Sjogren's negative,  scleroderma negative, rheumatoid factor negative -Hypersensitive pneumonitis panel Aspergillus or obesity and pigeon serum negative -ESR 11 -Angiotensin-converting enzymes 68 and considered normal in May 2022 at [facility]   PFT    PFT data for ILD  [facility] chst may 2022  FVC 1.19  FVC % 53%  Ratio 112%  TLC   TLC %   DLCO 28  DLCO % 172%   Simple office walk 185 feet x  3 laps goal with forehead probe 04/23/2021    O2 used ra   Number laps completed avg   Comments about pace 3   Resting Pulse Ox/HR 100% and 78/min   Final Pulse Ox/HR 98% and 95/min   Desaturated </= 88% no   Desaturated <= 3% points no   Got Tachycardic >/= 90/min yes   Symptoms at end of test Mild dysnea   Miscellaneous comments x      CT Chest data - HRCT  July 2022 at [facility]. Image NA  This result has an attachment that is not available.  COMPARISON: 08/19/2020.  INDICATION: ild  TECHNIQUE:  CT CHEST HIGH RESOLUTION -helical CT was performed through the chest with high-resolution technique which includes some space high-resolution axial sections. Neither prone, nor Expiration views were obtained. Contrast: None Thin axial sections were performed. Multiplanar reformats were generated on a workstation and saved to PACS.  Radiation dose reduction was utilized (automated exposure control, mA or kV adjustment based on patient size, or iterative image reconstruction).   Exam date/time: 10/26/2020 12:18 PM   FINDINGS:   LOWER NECK AND AXILLAE:  Scant thyroid tissue seen. Axillary regions are also grossly unremarkable.   LUNGS/PLEURA:  Primarily in the upper lobes, particularly on the right, there there are peribronchovascular infiltrates with minimal nodularity an peripheral interstitial septal thickening. Mild bronchial wall thickening, without bronchiectasis. There is calcification in the tracheobronchial tree which is a benign finding. The pattern and extent of the infiltrates anterior interstitial changes is  nearly identical to study from 08/19/2020.  No pneumothorax.  No abnormal pulmonary masses.  No pleural effusions, but there is some pleural or subpleural fascial thickening in the upper lobes.   HEART/MEDIASTINUM:  Cardiac size is normal.  Coronary Vessels: Moderate three-vessel coronary atherosclerosis.  No acute thoracic aortic abnormalities.  Ascending aorta is 2.7 cm.  No hilar, mediastinal, or axillary lymphadenopathy.   MUSCULOSKELETAL:  No acute or destructive osseous processes.   CHRONIC/INCIDENTAL FINDINGS:  #  Mild distal esophageal thickening is some gastroesophageal reflux.  #  Interval development of some ascites.  #  Limited visualization of the upper abdomen is otherwise benign-appearing Procedure Note  Stark Jock, MD - 10/26/2020  Formatting of this note might be different from the original.  COMPARISON: 08/19/2020.  INDICATION: ild  TECHNIQUE:  CT CHEST HIGH RESOLUTION -helical CT was performed through the chest with high-resolution technique which includes some space high-resolution axial sections. Neither prone, nor Expiration views were obtained. Contrast: None Thin axial sections were performed. Multiplanar reformats were generated on a workstation and saved to PACS.  Radiation dose reduction was utilized (automated exposure control, mA or kV adjustment based on patient size, or iterative image reconstruction).   Exam date/time: 10/26/2020 12:18 PM   FINDINGS:   LOWER NECK AND AXILLAE:  Scant thyroid tissue seen. Axillary regions are also grossly unremarkable.   LUNGS/PLEURA:  Primarily in the upper lobes, particularly on the right, there there are peribronchovascular infiltrates with minimal nodularity an peripheral interstitial septal thickening. Mild bronchial wall thickening, without bronchiectasis. There is calcification in the tracheobronchial tree which is a benign finding. The pattern and extent of the infiltrates anterior interstitial changes is  nearly identical to study from 08/19/2020.  No pneumothorax.  No abnormal pulmonary masses.  No pleural effusions, but there is some pleural or subpleural fascial thickening in the upper lobes.   HEART/MEDIASTINUM:  Cardiac size is normal.  Coronary Vessels: Moderate three-vessel coronary atherosclerosis.  No acute thoracic aortic abnormalities. Ascending aorta is 2.7 cm.  No hilar, mediastinal, or axillary lymphadenopathy.   MUSCULOSKELETAL:  No acute or destructive osseous processes.   CHRONIC/INCIDENTAL FINDINGS:  #  Mild distal esophageal thickening is some gastroesophageal reflux.  #  Interval development of some ascites.  #  Limited visualization of the upper abdomen is otherwise benign-appearing    IMPRESSION:  1.  Stable pattern of right greater than left, predominantly upper lobe peribronchial vascular infiltrates in an area interstitial bands and focal peripheral interstitial septal thickening. Bilateral upper lobe pleural or subpleural fascial thickening.  2.  New ascites in the upper abdomen.  3.  Coronary atherosclerosis.  4.  Mild distal esophageal thickening and gastroesophageal reflux.     OV 07/06/2021  Subjective:  Patient ID: Kelli Steele, female , DOB: March 09, 1941 , age 6 y.o. , MRN: [id number] , ADDRESS: [place] [phone] PCP No primary care provider on file. Patient Care Team: Merian Capron, MD (Unknown Physician Specialty)  This Provider for this visit: Treatment Team:  Attending Provider: Brand Males, MD    07/06/2021 -   Chief Complaint  Patient presents with   Follow-up    Pt states she has been doing okay since last visit. States still has complaints of SOB. States she has been in the hospital recently due to having a GI bleed.   ILD evaluation in progress Family history of IPF  HPI AMERE IOTT 81 y.o. -returns for follow-up after having high-resolution CT chest and also pulmonary function test.  This is  part of ongoing evaluation.  I also asked her to go see the genetics counselor.  She did see Mel Almond Flippin on 06/08/2021.  She gave blood work for pulmonary fibrosis gene panel.  The results are pending.  But then she got hospitalized between 06/21/2021  and 06/24/2021 at [facility] with anemia and black stools.  Hemoglobin was 8.4 g%.  She had 2 units of blood transfusion.  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  Review of the chart indicates she did get banding.  She says she now has compromised kidney function as well and she continues to be anemic although not actively bleeding.  It appears that cirrhosis is decompensated and she has a significant pedal edema for which she is on Lasix and Aldactone.  Her most recent hemoglobin 8.4 g% on 07/01/2021.  Her creatinine is 1.7 mg percent on 07/01/2021 compared to 1 mg percent on 06/26/2021.  She did have high-resolution CT chest that I personally visualized and showed it to her.  She did not bring the comparison CD and therefore is not uploaded.  But on the current CT there is motion artifact.  To me it looks inconsistent with UIP.  There is more upper lobe disease.  She does admit to continuing to have feather pillow.  She has had this for at least a dozen years.  She notices that she has wheezing and Advair helps her.  Her husband not here with [place] today because he is sitting in the car after having had a laminectomy recently.    No results found.   CT Chest data - HRCT 05/25/21   Narrative & Impression  CLINICAL DATA:  81 year old female with history of chronic lung disease with chronic shortness of breath. Evaluate for interstitial lung disease.   EXAM: CT CHEST WITHOUT CONTRAST   TECHNIQUE: Multidetector CT imaging of the chest was performed following the standard protocol without intravenous contrast. High resolution imaging of the lungs, as well as inspiratory and expiratory imaging, was performed.   RADIATION DOSE  REDUCTION: This exam was performed according to the departmental dose-optimization program which includes automated exposure control, adjustment of the mA and/or kV according to patient size and/or use of iterative reconstruction technique.   COMPARISON:  No priors.   FINDINGS: Cardiovascular: Heart size is enlarged with biatrial dilatation. There is no significant pericardial fluid, thickening or pericardial calcification. There is aortic atherosclerosis, as well as atherosclerosis of the great vessels of the mediastinum and the coronary arteries, including calcified atherosclerotic plaque in the left main, left anterior descending, left circumflex and right coronary arteries. Calcifications of the aortic valve. 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   Mediastinum/Nodes: Multiple prominent borderline enlarged mediastinal and hilar lymph nodes are noted, nonspecific. No definite pathologically enlarged lymph nodes. Esophagus is unremarkable in appearance. No axillary lymphadenopathy.   Lungs/Pleura: High-resolution images demonstrate widespread but patchy areas of mild ground-glass attenuation, septal thickening, subpleural reticulation, thickening of the peribronchovascular interstitium and regional areas of architectural distortion. No definite bronchiectasis or honeycombing. Findings have no craniocaudal gradient, and greatest involvement is in the anterior aspects of the upper lobes of the lungs bilaterally. Inspiratory and expiratory imaging is unremarkable. No acute consolidative airspace disease. No pleural effusions. No definite suspicious appearing pulmonary nodules or masses are noted.   Upper Abdomen: Aortic atherosclerosis.   Musculoskeletal: There are no aggressive appearing lytic or blastic lesions noted in the visualized portions of the skeleton.   IMPRESSION: 1. The appearance of the lungs is  compatible with interstitial lung disease, with a spectrum of findings considered most indicative of an alternative diagnosis (not usual interstitial pneumonia) per current ATS guidelines. Primary differential considerations are of nonspecific interstitial pneumonia (NSIP) or  chronic hypersensitivity pneumonitis, although the lack of air trapping is conspicuous. It is possible that the patient may not of adequately followed breathing instructions during today's examination. Repeat high-resolution chest CT is recommended in 12 months to assess for temporal changes in the appearance of the lung parenchyma. 2. Cardiomegaly with biatrial dilatation. 3. Aortic atherosclerosis, in addition to left main and three-vessel coronary artery disease. Assessment for potential risk factor modification, dietary therapy or pharmacologic therapy may be warranted, if clinically indicated. 4. There are calcifications of the aortic valve. Echocardiographic correlation for evaluation of potential valvular dysfunction may be warranted if clinically indicated.   Aortic Atherosclerosis (ICD10-I70.0).     Electronically Signed   By: Vinnie Langton M.D.   On: 05/26/2021 05:52     OV 10/05/2021  Subjective:  Patient ID: Kelli Steele, female , DOB: 05-16-1940 , age 11 y.o. , MRN: [id number] , ADDRESS: [place] [id number] PCP No primary care provider on file. Patient Care Team: Merian Capron, MD (Unknown Physician Specialty)  This Provider for this visit: Treatment Team:  Attending Provider: Brand Males, MD Primary gastroenterologist Dr. Exie Parody at [facility] health.   10/05/2021 -   Chief Complaint  Patient presents with   Follow-up    PFT performed today.  Pt states she has been doing okay since last visit. States she has not been very active due to cirrhosis of the liver.   Interstitial lung disease [NSIP versus HP high clinical probability]  -Work-up in  progress/on surveillance  -Exposure to feather pillow and resolution of wheezing in June 2023 after getting rid of feather pillow  Environmental allergy test + spring 2023  NASH/alcohol related cirrhosis  -Admission decompensation ascites with variceal bleeding December 2022 and status post banding March 2023 in April 2023  -Quit alcohol consumption  -Admissions December 2022 in January 2023 for acute blood loss anemia   Atrial fibrillation/flutter status post ablation therapy previously on anticoagulation [Eliquis and Plavix stopped December 2022]  Coronary artery disease status post stent placement March 2022  Acute renal failure with resolution admission May 2023  HPI Kelli Steele 81 y.o. -returns for follow-up.  Since her last visit she got hospitalized for acute renal failure in May 2023.  This resolved at discharge.  She entered hospice on because of the cirrhosis.  But she tells me that she is reconditioning quite well and she is going to terminate hospice service.  In terms of pulmonary fibrosis ILD she got into for further pillows and wheezing resolved/significantly improved.  There are some residual wheezing. Parental blood allergy test is positive she is taking Advair.  Overall no new issues other than the fact she is dealing with her liver issues.  I did review outside gastroenterology notes.  Yesterday's blood work shows jaundice bilirubin 3.8 she is going to follow-up with GI.  Overall frail not on oxygen.  Has edema and jaundice.  Has ongoing atrial fibrillation.  SYMPTOM SCALE - ILD 04/23/2021 07/06/2021 Variceal bleed. NASH cirrhosis 10/05/2021   Current weight     O2 use ra ra   Shortness of Breath 0 -> 5 scale with 5 being worst (score 6 If unable to do)    At rest 0 2   Simple tasks - showers, clothes change, eating, shaving 0 2   Household (dishes, doing bed, laundry) 2 4   Shopping 1 2   Walking level at own pace 1 2   Walking up Stairs 3 4   Total (  30-36)  Dyspnea Score 5 16   How bad is your cough? 0 2   How bad is your fatigue 2 4   How bad is nausea 0 1   How bad is vomiting?  0 0   How bad is diarrhea? 0 3   How bad is anxiety? 2 2   How bad is depression 1 2   Any chronic pain - if so where and how bad no       PFT data for ILD  [facility] chst may 2022  FVC 1.19  FVC % 53%  Ratio 112%  TLC   TLC %   DLCO 28  DLCO % 172%  -   PFT     Latest Ref Rng & Units 10/05/2021   10:02 AM 07/02/2021   11:08 AM  PFT Results  FVC-Pre L 1.03  P 0.99   FVC-Predicted Pre % 49  P 45   FVC-Post L  1.00   FVC-Predicted Post %  46   Pre FEV1/FVC % % 88  P 95   Post FEV1/FCV % %  98   FEV1-Pre L 0.91  P 0.95   FEV1-Predicted Pre % 59  P 59   FEV1-Post L  0.99   DLCO uncorrected ml/min/mmHg 8.80  P 16.31   DLCO UNC% % 55  P 100   DLCO corrected ml/min/mmHg 9.67  P 19.94   DLCO COR %Predicted % 61  P 123   DLVA Predicted % 107  P 108   TLC L  2.80   TLC % Predicted %  63   RV % Predicted %  51     P Preliminary result     Latest Reference Range & Units 07/06/21 11:45  Interpretation  Pend  WBC 4.0 - 10.5 K/uL 5.9  RBC 3.87 - 5.11 Mil/uL 3.30 (L)  Hemoglobin 12.0 - 15.0 g/dL 9.1 (L)  HCT 36.0 - 46.0 % 28.3 (L)  MCV 78.0 - 100.0 fl 85.8  MCHC 30.0 - 36.0 g/dL 32.1  RDW 11.5 - 15.5 % 27.3 (H)  Platelets 150.0 - 400.0 K/uL 207.0  Neutrophils 43.0 - 77.0 % 70.1  Lymphocytes 12.0 - 46.0 % 12.1  Monocytes Relative 3.0 - 12.0 % 10.5  Eosinophil 0.0 - 5.0 % 5.9 (H)  Basophil 0.0 - 3.0 % 1.4  NEUT# 1.4 - 7.7 K/uL 4.1  Lymphocyte # 0.7 - 4.0 K/uL 0.7  Monocyte # 0.1 - 1.0 K/uL 0.6  Eosinophils Absolute 0.0 - 0.7 K/uL 0.3  Basophils Absolute 0.0 - 0.1 K/uL 0.1  Sheep Sorrel IgE kU/L 4.65 (H)  Pecan/Hickory Tree IgE kU/L 2.28 (H)  IgE (Immunoglobulin E), Serum <OR=114 kU/L 168 (H)  Allergen, D pternoyssinus,d7 kU/L <0.10  Cat Dander kU/L <0.10  Dog Dander kU/L <0.10  [place] Grass kU/L 4.81 (H)  Johnson Grass kU/L 2.57 (H)   Timothy Grass kU/L 2.95 (H)  Cockroach kU/L 1.89 (H)  Aspergillus fumigatus, m3 kU/L <0.10  Allergen, Comm Silver Wendee Copp, t9 kU/L 2.25 (H)  Allergen, Cottonwood, t14 kU/L 1.48 (H)  Elm IgE kU/L 2.56 (H)  Allergen, Mulberry, t76 kU/L 1.68 (H)  Allergen, Oak,t7 kU/L 2.43 (H)  COMMON RAGWEED (SHORT) (W1) IGE kU/L 3.12 (H)  Allergen, Mouse Urine Protein, e78 kU/L <0.10  D. farinae kU/L 0.28 (H)  Allergen, Cedar tree, t12 kU/L 1.84 (H)  Box Elder IgE kU/L 2.49 (H)  Rough Pigweed  IgE kU/L 2.38 (H)  Allergen, A. alternata, m6 kU/L <0.10  Allergen, P.  notatum, m1 kU/L <0.10  CLADOSPORIUM HERBARUM (M2) IGE kU/L <0.10  Class  0 2 3 0/1 0 0 0 0 0 0 2 2 2  2 2 2 2 2 2 3 2 2 2  0   Laboratory values -October 04, 2021 at [facility] health: Bilirubin 3.8 -October 04, 2021 hemoglobin 11.3 g% at [facility] [10.8 g% in May - Aug 30, 2021 - creat 0.107m% --Chronic hyponatremia  Allergies  Allergen Reactions   Doxycycline Diarrhea and Rash   Sulfur Hives   Codeine Itching    Immunization History  Administered Date(s) Administered   Fluad Quad(high Dose 65+) 02/19/2016, 04/20/2018, 04/01/2021   Influenza Split 03/21/2011   Influenza, High Dose Seasonal PF 03/29/2012, 04/16/2015, 05/03/2017   PFIZER(Purple Top)SARS-COV-2 Vaccination 04/30/2019, 05/21/2019   Pneumococcal Conjugate-13 04/05/2013   Pneumococcal Polysaccharide-23 07/11/2008   Zoster Recombinat (Shingrix) 12/31/2016, 05/06/2017   Zoster, Live 09/10/2009    Family History  Problem Relation Age of Onset   Emphysema Mother    COPD Sister    Pulmonary fibrosis Brother    Lung cancer Maternal Uncle      Current Outpatient Medications:    ADVAIR HFA 230-21 MCG/ACT inhaler, SMARTSIG:2 Puff(s) By Mouth Twice Daily, Disp: , Rfl:    albuterol (ACCUNEB) 1.25 MG/3ML nebulizer solution, Take 3 mLs (1.25 mg dose) by nebulization every 6 (six) hours as needed for Wheezing., Disp: , Rfl:    ALPRAZolam (XANAX) 0.25 MG  tablet, Take 0.25 mg by mouth 2 (two) times daily as needed., Disp: , Rfl:    diltiazem (CARDIZEM SR) 120 MG 12 hr capsule, Take by mouth., Disp: , Rfl:    fluticasone (FLONASE) 50 MCG/ACT nasal spray, Place 2 sprays into both nostrils daily., Disp: , Rfl:    furosemide (LASIX) 20 MG tablet, Take 20 mg by mouth 2 (two) times daily., Disp: , Rfl:    levothyroxine (SYNTHROID) 75 MCG tablet, SMARTSIG:1 Tablet(s) By Mouth 6 Times a Week, Disp: , Rfl:    metolazone (ZAROXOLYN) 2.5 MG tablet, Take 2.5 mg by mouth once a week., Disp: , Rfl:    metoprolol tartrate (LOPRESSOR) 25 MG tablet, Take by mouth., Disp: , Rfl:    nitroGLYCERIN (NITROSTAT) 0.4 MG SL tablet, Place under the tongue., Disp: , Rfl:       Objective:   Vitals:   10/05/21 1042  BP: 128/80  Pulse: (!) 144  Temp: (!) 97.5 F (36.4 C)  TempSrc: Oral  SpO2: 98%  Weight: 129 lb (58.5 kg)  Height: 4' 11"  (1.499 m)    Estimated body mass index is 26.05 kg/m as calculated from the following:   Height as of this encounter: 4' 11"  (1.499 m).   Weight as of this encounter: 129 lb (58.5 kg).  @WEIGHTCHANGE @  [facility]  10/05/21 1042  Weight: 129 lb (58.5 kg)     Physical Exam  General: No distress. frail Neuro: Alert and Oriented x 3. GCS 15. Speech normal Psych: Pleasant Resp:  Barrel Chest - no.  Wheeze - no, Crackles - no, No overt respiratory distress CVS: Normal heart sounds. Murmurs - no Ext: Stigmata of Connective Tissue Disease - NO BUT HAS EDEMA HEENT: Normal upper airway. PEERL +. No post nasal drip. MILD JAUNDICE +        Assessment:       ICD-10-CM   1. ILD (interstitial lung disease) ([facility]  J84.9          Plan:     Patient Instructions  ICD-10-CM   1. ILD (interstitial lung disease) ([place])  J84.9     2. Family history of pulmonary fibrosis  Z83.6      Could be NSIP or HP from the down pillow  - wheezing and improvement with advair and down pillow and upper lobe disease raises  possibility of HP esp given improement after getting rid of feather pillow  - some residual wheezing + due to fluid overload, a fib but also blood test positive for various environmental allergens  Noted issues with cirrhosis and recent renal failure     Plan - recommend palliative approach only - antifibirotics can be toxic for liver (yesterday LFT shows jaundice) - continue advair  - hold off allergy doc referral until overall liver issues stabilize  Followup  - return to see Dr Chase Caller 30 min in 6 months or sooner if needed    SIGNATURE    Dr. Brand Males, M.D., F.C.C.P,  Pulmonary and Critical Care Medicine Staff Physician, [place] Director - Interstitial Lung Disease  Program  [facility] [place], [place], [address]  Pager: [phone], If no answer or between  15:00h - 7:00h: call [phone] Telephone: [phone]  11:06 AM 10/05/2021

## 2021-10-05 NOTE — Patient Instructions (Signed)
Spirometry and DLCO Performed Today.  

## 2021-10-05 NOTE — Patient Instructions (Addendum)
ICD-10-CM   1. ILD (interstitial lung disease) ([facility])  J84.9     2. Family history of pulmonary fibrosis  Z83.6      Could be NSIP or HP from the down pillow  - wheezing and improvement with advair and down pillow and upper lobe disease raises possibility of HP esp given improement after getting rid of feather pillow  - some residual wheezing + due to fluid overload, a fib but also blood test positive for various environmental allergens  Noted issues with cirrhosis and recent renal failure     Plan - recommend palliative approach only - antifibirotics can be toxic for liver (yesterday LFT shows jaundice) - continue advair  - hold off allergy doc referral until overall liver issues stabilize  Followup  - return to see Dr Marchelle Gearing 30 min in 6 months or sooner if needed

## 2021-10-05 NOTE — Progress Notes (Signed)
Spirometry and DLCO Performed Today.  

## 2022-01-16 DEATH — deceased

## 2023-04-28 IMAGING — CT CT CHEST HIGH RESOLUTION
2 of 8 series · 14 of 36 positions shown, 17 images · non-contrast
Comparison: No priors.

CLINICAL DATA: 80-year-old female with history of chronic lung
disease with chronic shortness of breath. Evaluate for interstitial
lung disease.



[Series 5: high resolution retro · axial · 0.56mm/px · z∈[-263,-59]mm · 11 of 246 slices shown, 14 images]
[im 21/246  mediastinal]
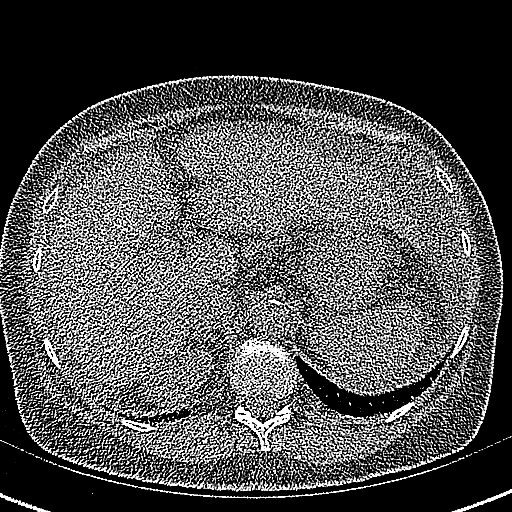
[im 21/246  lung]
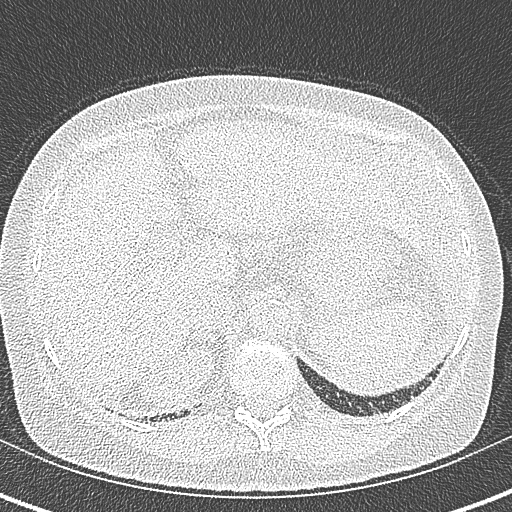
[im 41/246  lung]
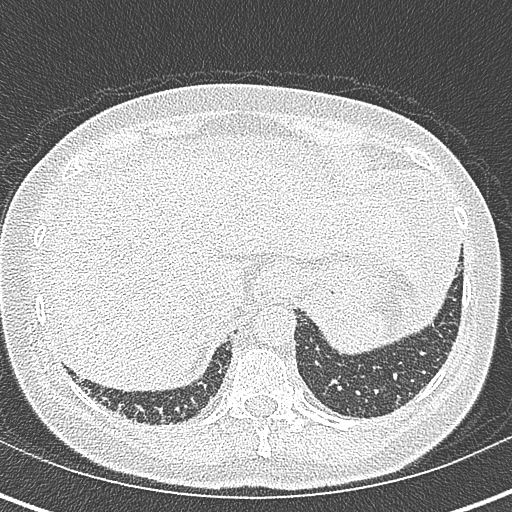
[im 62/246  lung]
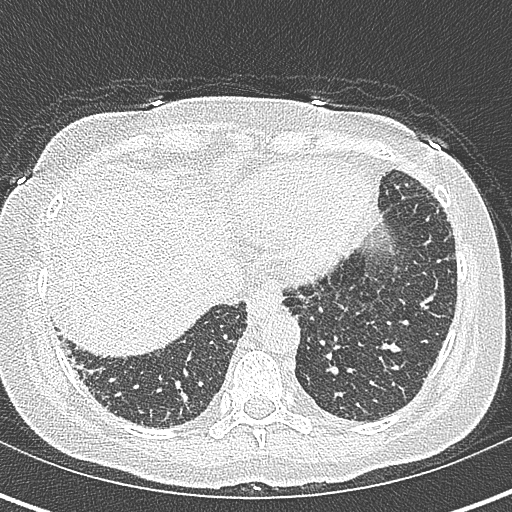
[im 82/246  lung]
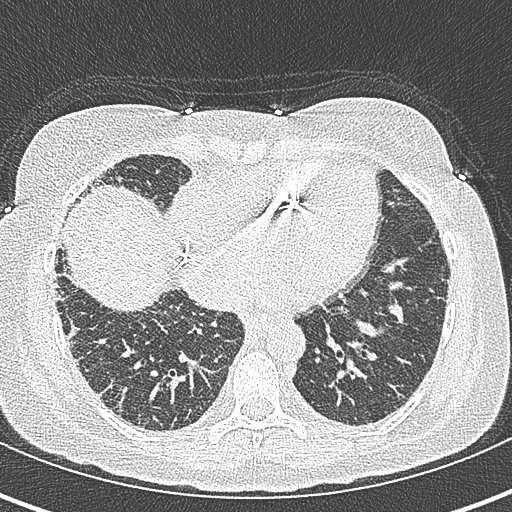
[im 103/246  mediastinal]
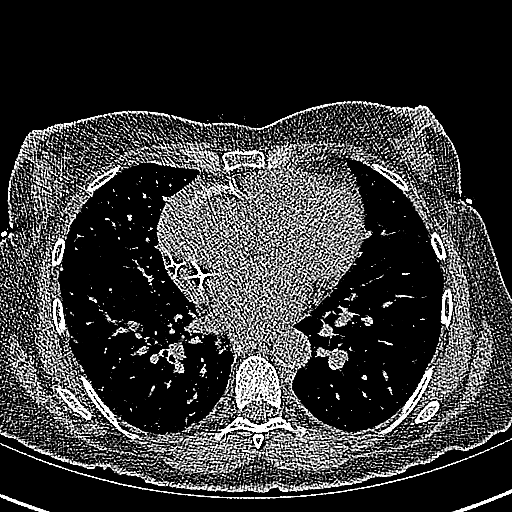
[im 103/246  lung]
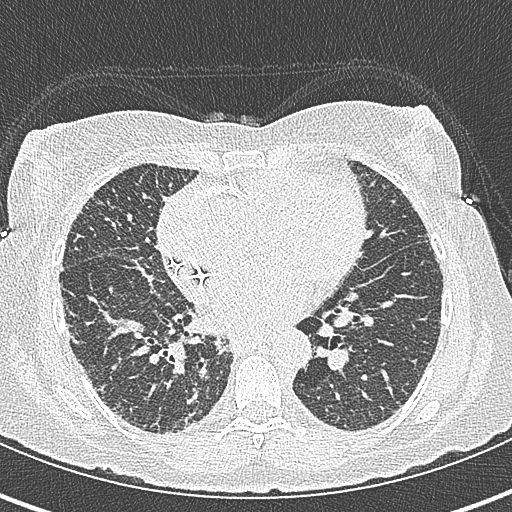
[im 123/246  lung]
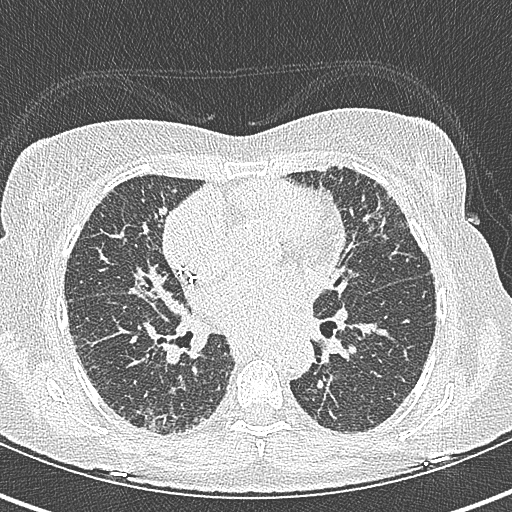
[im 143/246  lung]
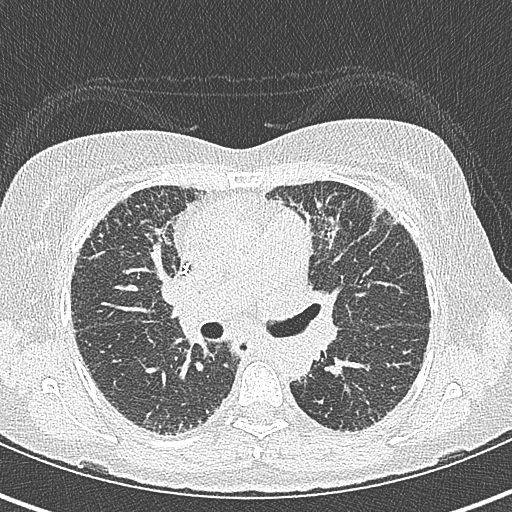
[im 164/246  lung]
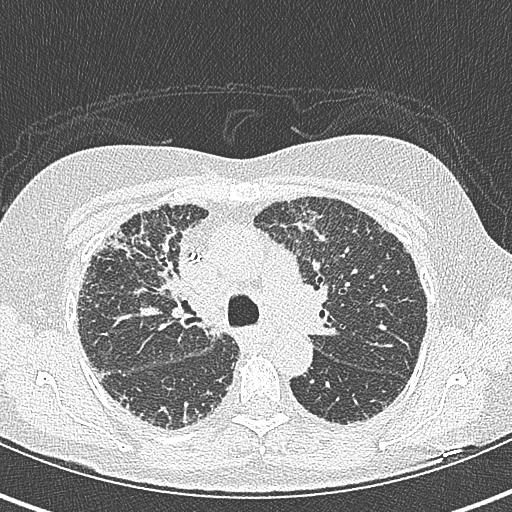
[im 184/246  mediastinal]
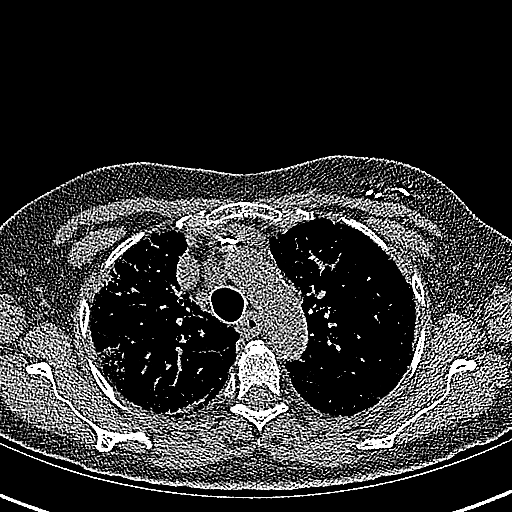
[im 184/246  lung]
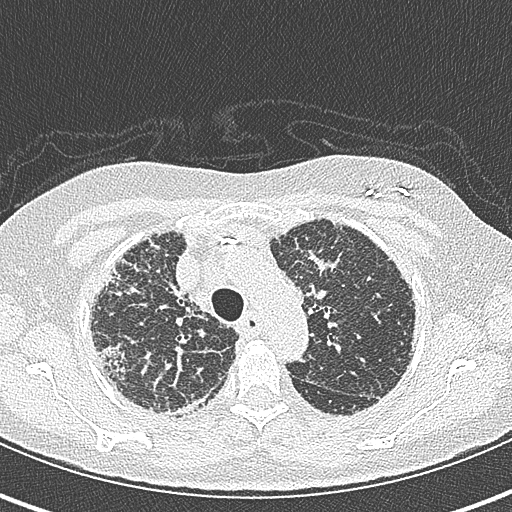
[im 205/246  lung]
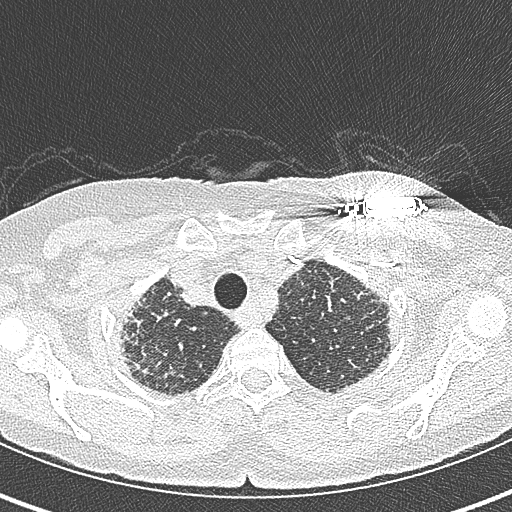
[im 225/246  lung]
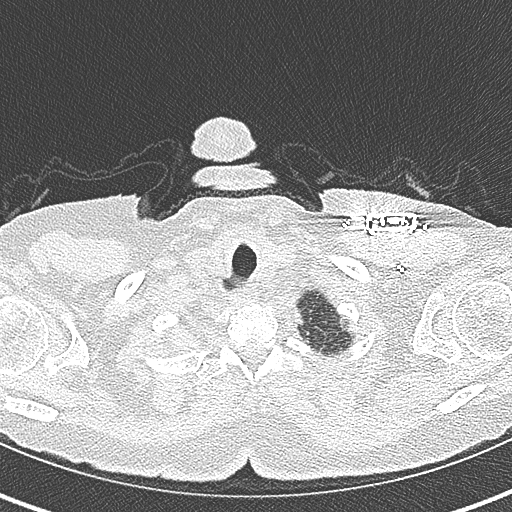

[Series 6: coronal · coronal · 0.51mm/px · 3 of 97 slices shown]
[im 20/97  lung]
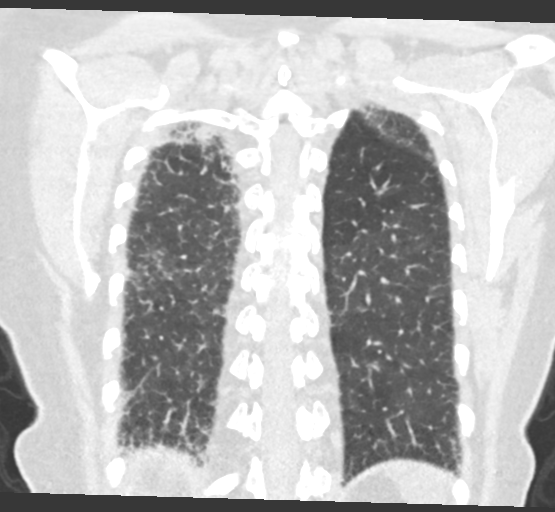
[im 39/97  lung]
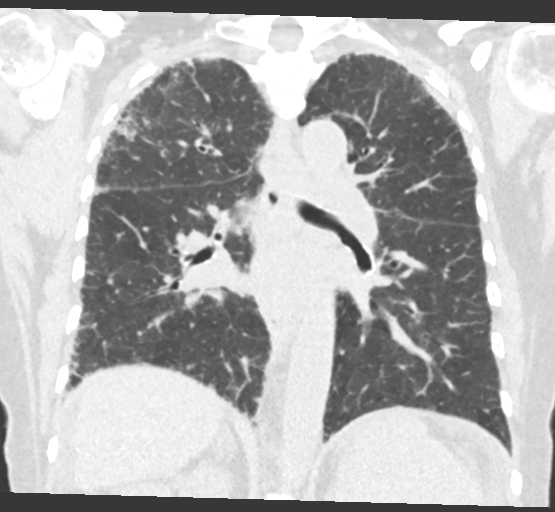
[im 58/97  lung]
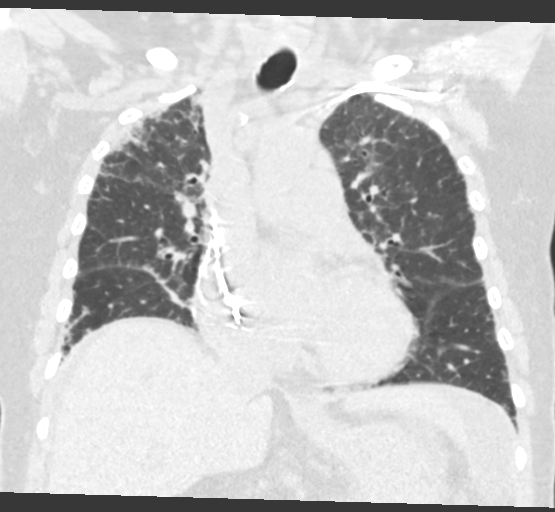

[14 of 36 positions shown; findings below may reference images not displayed]

FINDINGS: Cardiovascular: Heart size is enlarged with biatrial dilatation.
There is no significant pericardial fluid, thickening or pericardial
calcification. There is aortic atherosclerosis, as well as
atherosclerosis of the great vessels of the mediastinum and the
coronary arteries, including calcified atherosclerotic plaque in the
left main, left anterior descending, left circumflex and right
coronary arteries. Calcifications of the aortic valve. Left-sided
pacemaker device in place with lead tips terminating in the right
atrium and right ventricle.

Mediastinum/Nodes: Multiple prominent borderline enlarged
mediastinal and hilar lymph nodes are noted, nonspecific. No
definite pathologically enlarged lymph nodes. Esophagus is
unremarkable in appearance. No axillary lymphadenopathy.

Lungs/Pleura: High-resolution images demonstrate widespread but
patchy areas of mild ground-glass attenuation, septal thickening,
subpleural reticulation, thickening of the peribronchovascular
interstitium and regional areas of architectural distortion. No
definite bronchiectasis or honeycombing. Findings have no
craniocaudal gradient, and greatest involvement is in the anterior
aspects of the upper lobes of the lungs bilaterally. Inspiratory and
expiratory imaging is unremarkable. No acute consolidative airspace
disease. No pleural effusions. No definite suspicious appearing
pulmonary nodules or masses are noted.

Upper Abdomen: Aortic atherosclerosis.

Musculoskeletal: There are no aggressive appearing lytic or blastic
lesions noted in the visualized portions of the skeleton.
IMPRESSION: 1. The appearance of the lungs is compatible with interstitial lung
disease, with a spectrum of findings considered most indicative of
an alternative diagnosis (not usual interstitial pneumonia) per
current ATS guidelines. Primary differential considerations are of
nonspecific interstitial pneumonia (NSIP) or chronic
hypersensitivity pneumonitis, although the lack of air trapping is
conspicuous. It is possible that the patient may not of adequately
followed breathing instructions during today's examination. Repeat
high-resolution chest CT is recommended in 12 months to assess for
temporal changes in the appearance of the lung parenchyma.
2. Cardiomegaly with biatrial dilatation.
3. Aortic atherosclerosis, in addition to left main and three-vessel
coronary artery disease. Assessment for potential risk factor
modification, dietary therapy or pharmacologic therapy may be
warranted, if clinically indicated.
4. There are calcifications of the aortic valve. Echocardiographic
correlation for evaluation of potential valvular dysfunction may be
warranted if clinically indicated.

Aortic Atherosclerosis ([id number]-V24.4).
